# Patient Record
Sex: Female | Born: 1945 | Race: White | Hispanic: No | Marital: Married | Smoking: Never smoker
Health system: Southern US, Community
[De-identification: ages and names within clinical notes are randomized; demographics above are authoritative.]

## PROBLEM LIST (undated history)

## (undated) DIAGNOSIS — R51 Headache: Secondary | ICD-10-CM

## (undated) DIAGNOSIS — R0600 Dyspnea, unspecified: Secondary | ICD-10-CM

## (undated) DIAGNOSIS — R519 Headache, unspecified: Secondary | ICD-10-CM

## (undated) DIAGNOSIS — Z9889 Other specified postprocedural states: Secondary | ICD-10-CM

## (undated) DIAGNOSIS — I499 Cardiac arrhythmia, unspecified: Secondary | ICD-10-CM

## (undated) DIAGNOSIS — T753XXA Motion sickness, initial encounter: Secondary | ICD-10-CM

## (undated) DIAGNOSIS — R112 Nausea with vomiting, unspecified: Secondary | ICD-10-CM

## (undated) DIAGNOSIS — K219 Gastro-esophageal reflux disease without esophagitis: Secondary | ICD-10-CM

## (undated) DIAGNOSIS — F329 Major depressive disorder, single episode, unspecified: Secondary | ICD-10-CM

## (undated) DIAGNOSIS — F32A Depression, unspecified: Secondary | ICD-10-CM

## (undated) DIAGNOSIS — Z8489 Family history of other specified conditions: Secondary | ICD-10-CM

## (undated) DIAGNOSIS — F419 Anxiety disorder, unspecified: Secondary | ICD-10-CM

## (undated) DIAGNOSIS — Z8719 Personal history of other diseases of the digestive system: Secondary | ICD-10-CM

## (undated) HISTORY — PX: ABDOMINAL HYSTERECTOMY: SHX81

## (undated) HISTORY — PX: CATARACT EXTRACTION W/ INTRAOCULAR LENS IMPLANT: SHX1309

## (undated) HISTORY — PX: TONSILLECTOMY: SUR1361

## (undated) HISTORY — PX: HERNIA REPAIR: SHX51

## (undated) HISTORY — PX: PELVIC FLOOR REPAIR: SHX2192

---

## 2005-06-23 ENCOUNTER — Ambulatory Visit: Payer: Self-pay | Admitting: Family Medicine

## 2006-02-14 ENCOUNTER — Ambulatory Visit: Payer: Self-pay | Admitting: Family Medicine

## 2007-05-16 ENCOUNTER — Ambulatory Visit: Payer: Self-pay

## 2008-09-04 ENCOUNTER — Ambulatory Visit: Payer: Self-pay | Admitting: Cardiology

## 2008-09-04 HISTORY — PX: CARDIAC CATHETERIZATION: SHX172

## 2009-03-09 ENCOUNTER — Ambulatory Visit: Payer: Self-pay | Admitting: Family Medicine

## 2009-03-11 ENCOUNTER — Ambulatory Visit: Payer: Self-pay | Admitting: Family Medicine

## 2011-03-08 ENCOUNTER — Ambulatory Visit: Payer: Self-pay | Admitting: Family Medicine

## 2011-03-10 ENCOUNTER — Ambulatory Visit: Payer: Self-pay | Admitting: Family Medicine

## 2012-11-28 ENCOUNTER — Ambulatory Visit: Payer: Self-pay | Admitting: Family Medicine

## 2013-03-05 ENCOUNTER — Ambulatory Visit: Payer: Self-pay | Admitting: Emergency Medicine

## 2015-10-22 ENCOUNTER — Ambulatory Visit
Admission: RE | Admit: 2015-10-22 | Discharge: 2015-10-22 | Disposition: A | Discharge status: Home / Self Care | Payer: Medicare Other | Source: Ambulatory Visit | Attending: Family Medicine | Admitting: Family Medicine

## 2015-10-22 ENCOUNTER — Other Ambulatory Visit: Payer: Self-pay | Admitting: Family Medicine

## 2015-10-22 DIAGNOSIS — R0609 Other forms of dyspnea: Secondary | ICD-10-CM | POA: Insufficient documentation

## 2016-04-28 ENCOUNTER — Other Ambulatory Visit: Payer: Self-pay | Admitting: Pediatrics

## 2016-04-28 DIAGNOSIS — M810 Age-related osteoporosis without current pathological fracture: Secondary | ICD-10-CM

## 2016-05-03 NOTE — Discharge Instructions (Signed)

## 2016-05-06 ENCOUNTER — Encounter: Payer: Self-pay | Admitting: *Deleted

## 2016-05-11 ENCOUNTER — Encounter
Admission: RE | Disposition: A | Discharge status: Home / Self Care | Payer: Self-pay | Source: Ambulatory Visit | Attending: Ophthalmology

## 2016-05-11 ENCOUNTER — Ambulatory Visit
Admission: RE | Admit: 2016-05-11 | Discharge: 2016-05-11 | Disposition: A | Discharge status: Home / Self Care | Payer: Medicare Other | Source: Ambulatory Visit | Attending: Ophthalmology | Admitting: Ophthalmology

## 2016-05-11 ENCOUNTER — Ambulatory Visit: Payer: Medicare Other | Admitting: Anesthesiology

## 2016-05-11 DIAGNOSIS — K449 Diaphragmatic hernia without obstruction or gangrene: Secondary | ICD-10-CM | POA: Diagnosis not present

## 2016-05-11 DIAGNOSIS — F419 Anxiety disorder, unspecified: Secondary | ICD-10-CM | POA: Diagnosis not present

## 2016-05-11 DIAGNOSIS — R51 Headache: Secondary | ICD-10-CM | POA: Insufficient documentation

## 2016-05-11 DIAGNOSIS — K219 Gastro-esophageal reflux disease without esophagitis: Secondary | ICD-10-CM | POA: Insufficient documentation

## 2016-05-11 DIAGNOSIS — H2512 Age-related nuclear cataract, left eye: Secondary | ICD-10-CM | POA: Diagnosis not present

## 2016-05-11 DIAGNOSIS — Z79899 Other long term (current) drug therapy: Secondary | ICD-10-CM | POA: Diagnosis not present

## 2016-05-11 HISTORY — DX: Gastro-esophageal reflux disease without esophagitis: K21.9

## 2016-05-11 HISTORY — DX: Family history of other specified conditions: Z84.89

## 2016-05-11 HISTORY — DX: Depression, unspecified: F32.A

## 2016-05-11 HISTORY — DX: Headache, unspecified: R51.9

## 2016-05-11 HISTORY — DX: Cardiac arrhythmia, unspecified: I49.9

## 2016-05-11 HISTORY — DX: Headache: R51

## 2016-05-11 HISTORY — DX: Major depressive disorder, single episode, unspecified: F32.9

## 2016-05-11 HISTORY — DX: Anxiety disorder, unspecified: F41.9

## 2016-05-11 HISTORY — DX: Other specified postprocedural states: Z98.890

## 2016-05-11 HISTORY — PX: CATARACT EXTRACTION W/PHACO: SHX586

## 2016-05-11 HISTORY — DX: Motion sickness, initial encounter: T75.3XXA

## 2016-05-11 HISTORY — DX: Other specified postprocedural states: R11.2

## 2016-05-11 HISTORY — DX: Dyspnea, unspecified: R06.00

## 2016-05-11 HISTORY — DX: Personal history of other diseases of the digestive system: Z87.19

## 2016-05-11 SURGERY — PHACOEMULSIFICATION, CATARACT, WITH IOL INSERTION
Anesthesia: Monitor Anesthesia Care | Laterality: Left | Wound class: Clean

## 2016-05-11 MED ORDER — LIDOCAINE HCL (PF) 4 % IJ SOLN
INTRAMUSCULAR | Status: DC | PRN
  Administered 2016-05-11: 1 mL via OPHTHALMIC

## 2016-05-11 MED ORDER — MIDAZOLAM HCL 2 MG/2ML IJ SOLN
INTRAMUSCULAR | Status: DC | PRN
  Administered 2016-05-11: 2 mg via INTRAVENOUS

## 2016-05-11 MED ORDER — EPINEPHRINE PF 1 MG/ML IJ SOLN
INTRAMUSCULAR | Status: DC | PRN
  Administered 2016-05-11: 72 mL via OPHTHALMIC

## 2016-05-11 MED ORDER — NA HYALUR & NA CHOND-NA HYALUR 0.4-0.35 ML IO KIT
PACK | INTRAOCULAR | Status: DC | PRN
  Administered 2016-05-11: 1 mL via INTRAOCULAR

## 2016-05-11 MED ORDER — CEFUROXIME OPHTHALMIC INJECTION 1 MG/0.1 ML
INJECTION | OPHTHALMIC | Status: DC | PRN
  Administered 2016-05-11: .3 mL via INTRACAMERAL

## 2016-05-11 MED ORDER — FENTANYL CITRATE (PF) 100 MCG/2ML IJ SOLN
INTRAMUSCULAR | Status: DC | PRN
  Administered 2016-05-11: 50 ug via INTRAVENOUS

## 2016-05-11 MED ORDER — ARMC OPHTHALMIC DILATING DROPS
1.0000 "application " | OPHTHALMIC | Status: DC | PRN
  Administered 2016-05-11 (×3): 1 via OPHTHALMIC

## 2016-05-11 MED ORDER — TIMOLOL MALEATE 0.5 % OP SOLN
OPHTHALMIC | Status: DC | PRN
  Administered 2016-05-11: 1 [drp] via OPHTHALMIC

## 2016-05-11 MED ORDER — BRIMONIDINE TARTRATE 0.2 % OP SOLN
OPHTHALMIC | Status: DC | PRN
  Administered 2016-05-11: 1 [drp] via OPHTHALMIC

## 2016-05-11 MED ORDER — MOXIFLOXACIN HCL 0.5 % OP SOLN
1.0000 [drp] | OPHTHALMIC | Status: DC | PRN
  Administered 2016-05-11 (×3): 1 [drp] via OPHTHALMIC

## 2016-05-11 SURGICAL SUPPLY — 25 items
CANNULA ANT/CHMB 27GA (MISCELLANEOUS) ×3 IMPLANT
CARTRIDGE ABBOTT (MISCELLANEOUS) IMPLANT
GLOVE SURG LX 7.5 STRW (GLOVE) ×2
GLOVE SURG LX STRL 7.5 STRW (GLOVE) ×1 IMPLANT
GLOVE SURG TRIUMPH 8.0 PF LTX (GLOVE) ×3 IMPLANT
GOWN STRL REUS W/ TWL LRG LVL3 (GOWN DISPOSABLE) ×2 IMPLANT
GOWN STRL REUS W/TWL LRG LVL3 (GOWN DISPOSABLE) ×4
LENS IOL TECNIS ITEC 14.5 (Intraocular Lens) ×3 IMPLANT
MARKER SKIN DUAL TIP RULER LAB (MISCELLANEOUS) ×3 IMPLANT
NDL RETROBULBAR .5 NSTRL (NEEDLE) IMPLANT
NEEDLE FILTER BLUNT 18X 1/2SAF (NEEDLE) ×2
NEEDLE FILTER BLUNT 18X1 1/2 (NEEDLE) ×1 IMPLANT
PACK CATARACT BRASINGTON (MISCELLANEOUS) ×3 IMPLANT
PACK EYE AFTER SURG (MISCELLANEOUS) ×3 IMPLANT
PACK OPTHALMIC (MISCELLANEOUS) ×3 IMPLANT
RING MALYGIN 7.0 (MISCELLANEOUS) IMPLANT
SUT ETHILON 10-0 CS-B-6CS-B-6 (SUTURE)
SUT VICRYL  9 0 (SUTURE)
SUT VICRYL 9 0 (SUTURE) IMPLANT
SUTURE EHLN 10-0 CS-B-6CS-B-6 (SUTURE) IMPLANT
SYR 3ML LL SCALE MARK (SYRINGE) ×3 IMPLANT
SYR 5ML LL (SYRINGE) ×3 IMPLANT
SYR TB 1ML LUER SLIP (SYRINGE) ×3 IMPLANT
WATER STERILE IRR 250ML POUR (IV SOLUTION) ×3 IMPLANT
WIPE NON LINTING 3.25X3.25 (MISCELLANEOUS) ×3 IMPLANT

## 2016-05-11 NOTE — Anesthesia Procedure Notes (Signed)
Procedure Name: MAC Performed by: Isiah Scheel Pre-anesthesia Checklist: Patient identified, Emergency Drugs available, Suction available, Timeout performed and Patient being monitored Patient Re-evaluated:Patient Re-evaluated prior to inductionOxygen Delivery Method: Nasal cannula Placement Confirmation: positive ETCO2     

## 2016-05-11 NOTE — H&P (Signed)
The History and Physical notes are on paper, have been signed, and are to be scanned. The patient remains stable and unchanged from the H&P.   Previous H&P reviewed, patient examined, and there are no changes.  Asaph Serena 05/11/2016 8:46 AM

## 2016-05-11 NOTE — Transfer of Care (Signed)
Immediate Anesthesia Transfer of Care Note  Patient: Kim Ramos  Procedure(s) Performed: Procedure(s) with comments: CATARACT EXTRACTION PHACO AND INTRAOCULAR LENS PLACEMENT (IOC) (Left) - requests early  Patient Location: PACU  Anesthesia Type: MAC  Level of Consciousness: awake, alert  and patient cooperative  Airway and Oxygen Therapy: Patient Spontanous Breathing and Patient connected to supplemental oxygen  Post-op Assessment: Post-op Vital signs reviewed, Patient's Cardiovascular Status Stable, Respiratory Function Stable, Patent Airway and No signs of Nausea or vomiting  Post-op Vital Signs: Reviewed and stable  Complications: No apparent anesthesia complications

## 2016-05-11 NOTE — Anesthesia Postprocedure Evaluation (Signed)
Anesthesia Post Note  Patient: Kim Ramos  Procedure(s) Performed: Procedure(s) (LRB): CATARACT EXTRACTION PHACO AND INTRAOCULAR LENS PLACEMENT (IOC) (Left)  Patient location during evaluation: PACU Anesthesia Type: MAC Level of consciousness: awake and alert Pain management: pain level controlled Vital Signs Assessment: post-procedure vital signs reviewed and stable Respiratory status: spontaneous breathing, nonlabored ventilation, respiratory function stable and patient connected to nasal cannula oxygen Cardiovascular status: stable and blood pressure returned to baseline Anesthetic complications: no    Scarlette Sliceachel B Beach

## 2016-05-11 NOTE — Anesthesia Preprocedure Evaluation (Signed)
Anesthesia Evaluation  Patient identified by MRN, date of birth, ID band Patient awake    Reviewed: Allergy & Precautions, H&P , NPO status , Patient's Chart, lab work & pertinent test results, reviewed documented beta blocker date and time   History of Anesthesia Complications (+) PONV and history of anesthetic complications  Airway Mallampati: II  TM Distance: >3 FB Neck ROM: full    Dental no notable dental hx.    Pulmonary neg pulmonary ROS,    Pulmonary exam normal breath sounds clear to auscultation       Cardiovascular Exercise Tolerance: Good + dysrhythmias (palpitations)  Rhythm:regular Rate:Normal     Neuro/Psych  Headaches, PSYCHIATRIC DISORDERS (anxiety)    GI/Hepatic Neg liver ROS, hiatal hernia, GERD  ,  Endo/Other  negative endocrine ROS  Renal/GU negative Renal ROS  negative genitourinary   Musculoskeletal   Abdominal   Peds  Hematology negative hematology ROS (+)   Anesthesia Other Findings   Reproductive/Obstetrics negative OB ROS                             Anesthesia Physical Anesthesia Plan  ASA: II  Anesthesia Plan: MAC   Post-op Pain Management:    Induction:   Airway Management Planned:   Additional Equipment:   Intra-op Plan:   Post-operative Plan:   Informed Consent: I have reviewed the patients History and Physical, chart, labs and discussed the procedure including the risks, benefits and alternatives for the proposed anesthesia with the patient or authorized representative who has indicated his/her understanding and acceptance.   Dental Advisory Given  Plan Discussed with: CRNA  Anesthesia Plan Comments:         Anesthesia Quick Evaluation

## 2016-05-11 NOTE — Op Note (Signed)
OPERATIVE NOTE  Kim BinningVicki P Ramos [id number] 05/11/2016   PREOPERATIVE DIAGNOSIS:  Nuclear sclerotic cataract left eye. H25.12   POSTOPERATIVE DIAGNOSIS:    Nuclear sclerotic cataract left eye.     PROCEDURE:  Phacoemusification with posterior chamber intraocular lens placement of the left eye   LENS:   Implant Name Type Inv. Item Serial No. Manufacturer Lot No. LRB No. Used  LENS IOL DIOP 14.5 - [id number] Intraocular Lens LENS IOL DIOP 14.5 [id number] AMO   Left 1        ULTRASOUND TIME: 11  % of 0 minutes 46+ seconds, CDE 5.1  SURGEON:  Deirdre Evenerhadwick R. Alexa Blish, MD   ANESTHESIA:  Topical with tetracaine drops and 2% Xylocaine jelly, augmented with 1% preservative-free intracameral lidocaine.    COMPLICATIONS:  None.   DESCRIPTION OF PROCEDURE:  The patient was identified in the holding room and transported to the operating room and placed in the supine position under the operating microscope.  The left eye was identified as the operative eye and it was prepped and draped in the usual sterile ophthalmic fashion.   A 1 millimeter clear-corneal paracentesis was made at the 1:30 position.  0.5 ml of preservative-free 1% lidocaine was injected into the anterior chamber.  The anterior chamber was filled with Viscoat viscoelastic.  A 2.4 millimeter keratome was used to make a near-clear corneal incision at the 10:30 position.  .  A curvilinear capsulorrhexis was made with a cystotome and capsulorrhexis forceps.  Balanced salt solution was used to hydrodissect and hydrodelineate the nucleus.   Phacoemulsification was then used in stop and chop fashion to remove the lens nucleus and epinucleus.  The remaining cortex was then removed using the irrigation and aspiration handpiece. Provisc was then placed into the capsular bag to distend it for lens placement.  A lens was then injected into the capsular bag.  The remaining viscoelastic was aspirated.   Wounds were hydrated with balanced salt  solution.  The anterior chamber was inflated to a physiologic pressure with balanced salt solution.  No wound leaks were noted. Cefuroxime 0.1 ml of a 10mg /ml solution was injected into the anterior chamber for a dose of 1 mg of intracameral antibiotic at the completion of the case.   Timolol and Brimonidine drops were applied to the eye.  The patient was taken to the recovery room in stable condition without complications of anesthesia or surgery.  Peniel Biel 05/11/2016, 9:35 AM

## 2016-05-12 ENCOUNTER — Encounter: Payer: Self-pay | Admitting: Ophthalmology

## 2017-08-23 ENCOUNTER — Other Ambulatory Visit: Payer: Self-pay | Admitting: Family Medicine

## 2017-08-23 DIAGNOSIS — Z1239 Encounter for other screening for malignant neoplasm of breast: Secondary | ICD-10-CM

## 2017-08-29 ENCOUNTER — Other Ambulatory Visit: Payer: Self-pay | Admitting: Family Medicine

## 2017-08-29 DIAGNOSIS — Z780 Asymptomatic menopausal state: Secondary | ICD-10-CM

## 2017-11-28 ENCOUNTER — Other Ambulatory Visit: Payer: Self-pay | Admitting: Family Medicine

## 2017-11-28 DIAGNOSIS — R2232 Localized swelling, mass and lump, left upper limb: Secondary | ICD-10-CM

## 2017-12-05 ENCOUNTER — Ambulatory Visit
Admission: RE | Admit: 2017-12-05 | Discharge: 2017-12-05 | Disposition: A | Discharge status: Home / Self Care | Payer: Medicare Other | Source: Ambulatory Visit | Attending: Family Medicine | Admitting: Family Medicine

## 2017-12-05 DIAGNOSIS — R2232 Localized swelling, mass and lump, left upper limb: Secondary | ICD-10-CM | POA: Insufficient documentation

## 2018-08-27 ENCOUNTER — Ambulatory Visit
Admission: RE | Admit: 2018-08-27 | Discharge: 2018-08-27 | Disposition: A | Discharge status: Home / Self Care | Payer: Medicare Other | Source: Ambulatory Visit | Attending: Family Medicine | Admitting: Family Medicine

## 2018-08-27 ENCOUNTER — Other Ambulatory Visit: Payer: Self-pay

## 2018-08-27 DIAGNOSIS — Z1239 Encounter for other screening for malignant neoplasm of breast: Secondary | ICD-10-CM | POA: Diagnosis present

## 2018-08-27 DIAGNOSIS — Z780 Asymptomatic menopausal state: Secondary | ICD-10-CM

## 2018-08-27 DIAGNOSIS — Z1231 Encounter for screening mammogram for malignant neoplasm of breast: Secondary | ICD-10-CM | POA: Insufficient documentation

## 2018-08-27 DIAGNOSIS — M810 Age-related osteoporosis without current pathological fracture: Secondary | ICD-10-CM | POA: Insufficient documentation

## 2020-06-18 ENCOUNTER — Other Ambulatory Visit: Payer: Self-pay | Admitting: Family Medicine

## 2020-06-18 DIAGNOSIS — M810 Age-related osteoporosis without current pathological fracture: Secondary | ICD-10-CM

## 2020-10-02 ENCOUNTER — Ambulatory Visit
Admission: EM | Admit: 2020-10-02 | Discharge: 2020-10-02 | Disposition: A | Discharge status: Home / Self Care | Payer: Medicare PPO | Attending: Emergency Medicine | Admitting: Emergency Medicine

## 2020-10-02 ENCOUNTER — Other Ambulatory Visit: Payer: Self-pay

## 2020-10-02 DIAGNOSIS — W540XXA Bitten by dog, initial encounter: Secondary | ICD-10-CM | POA: Diagnosis not present

## 2020-10-02 DIAGNOSIS — S61451A Open bite of right hand, initial encounter: Secondary | ICD-10-CM

## 2020-10-02 MED ORDER — TETANUS-DIPHTH-ACELL PERTUSSIS 5-2.5-18.5 LF-MCG/0.5 IM SUSY: 0.5000 mL | PREFILLED_SYRINGE | Freq: Once | INTRAMUSCULAR | Status: DC

## 2020-10-02 MED ORDER — HIBICLENS 4 % EX LIQD: Freq: Every day | CUTANEOUS | 0 refills | Status: AC | PRN

## 2020-10-02 MED ORDER — TETANUS-DIPHTHERIA TOXOIDS TD 5-2 LFU IM INJ
0.5000 mL | INJECTION | Freq: Once | INTRAMUSCULAR | Status: AC
  Administered 2020-10-02: 0.5 mL via INTRAMUSCULAR

## 2020-10-02 MED ORDER — AMOXICILLIN-POT CLAVULANATE 875-125 MG PO TABS: 1.0000 | ORAL_TABLET | Freq: Two times a day (BID) | ORAL | 0 refills | Status: AC

## 2020-10-02 NOTE — ED Provider Notes (Signed)
HPI  SUBJECTIVE:  Kim Ramos is a right handed 75 y.o. female who presents with a dog bite to her right index and ring finger sustained earlier today at 0330.  States that she was bitten by her dog while it was having a seizure.  All of its shots are up-to-date.  She reports dull, achy pain described as "uncomfortable".  She reports swelling of the index finger.  No erythema, foreign body sensation, limitation of motion, numbness or tingling.  No purulent drainage.  No other injury to the hand.  She has tried irrigating the wounds out with water.  No aggravating or alleviating factors.  Tetanus is unknown.  She has no past medical history.  PMD: Leim Fabry, MD    Past Medical History:  Diagnosis Date  . Anxiety   . Depression   . Dyspnea    with exertion  . Dysrhythmia    palpatations  . Family history of adverse reaction to anesthesia    mother - PONV, brother - slow to awaken  . GERD (gastroesophageal reflux disease)   . Headache    weekly  . History of hiatal hernia   . Motion sickness    cars, boats, planes  . PONV (postoperative nausea and vomiting)     Past Surgical History:  Procedure Laterality Date  . ABDOMINAL HYSTERECTOMY    . CARDIAC CATHETERIZATION  09/04/2008   [facility], Dr. Darrold Junker  . CATARACT EXTRACTION W/ INTRAOCULAR LENS IMPLANT Right   . CATARACT EXTRACTION W/PHACO Left 05/11/2016   Procedure: CATARACT EXTRACTION PHACO AND INTRAOCULAR LENS PLACEMENT (IOC);  Surgeon: Lockie Mola, MD;  Location: [facility];  Service: Ophthalmology;  Laterality: Left;  requests early  . HERNIA REPAIR    . PELVIC FLOOR REPAIR    . TONSILLECTOMY      History reviewed. No pertinent family history.  Social History   Tobacco Use  . Smoking status: Never Smoker  . Smokeless tobacco: Never Used  Vaping Use  . Vaping Use: Never used  Substance Use Topics  . Alcohol use: Yes    Alcohol/week: 7.0 standard drinks    Types: 7 Glasses of wine per week     No current facility-administered medications for this encounter.  Current Outpatient Medications:  .  acetaminophen (TYLENOL) 500 MG tablet, Take 1,000 mg by mouth at bedtime as needed., Disp: , Rfl:  .  amoxicillin-clavulanate (AUGMENTIN) 875-125 MG tablet, Take 1 tablet by mouth 2 (two) times daily for 10 days., Disp: 20 tablet, Rfl: 0 .  Artificial Tear Ointment (LACRI-LUBE OP), Apply to eye 4 (four) times daily as needed., Disp: , Rfl:  .  chlorhexidine (HIBICLENS) 4 % external liquid, Apply topically daily as needed. Dilute 10-15 mL in water, Use daily when bathing for 1-2 weeks, Disp: 120 mL, Rfl: 0 .  Cyanocobalamin (B-12) 1000 MCG SUBL, Place under the tongue., Disp: , Rfl:  .  diphenhydrAMINE (BENADRYL) 25 MG tablet, Take 25 mg by mouth at bedtime as needed., Disp: , Rfl:  .  latanoprost (XALATAN) 0.005 % ophthalmic solution, Place 1 drop into both eyes at bedtime., Disp: , Rfl:  .  Polyethyl Glycol-Propyl Glycol (SYSTANE OP), Apply to eye at bedtime., Disp: , Rfl:  .  pseudoephedrine (SUDAFED) 120 MG 12 hr tablet, Take 120 mg by mouth every 12 (twelve) hours as needed for congestion., Disp: , Rfl:  .  TRINTELLIX 10 MG TABS tablet, Take by mouth., Disp: , Rfl:  .  omeprazole (PRILOSEC) 20 MG capsule,  Take 20 mg by mouth 2 (two) times daily before a meal., Disp: , Rfl:   Allergies  Allergen Reactions  . Ibuprofen Other (See Comments)    Stomach "burning"  . Prednisolone Swelling    Facial swelling with eye drops  . Cephalexin Nausea And Vomiting    Pt reports that she took this medication approximately 6 months ago and she experienced nausea and vomiting.    . Sulfa Antibiotics Rash     ROS  As noted in HPI.   Physical Exam  BP (!) 153/85 (BP Location: Left Arm)   Pulse (!) 102   Temp 98.1 F (36.7 C) (Oral)   Resp 18   Ht 5\' 3"  (1.6 m)   Wt 68 kg   SpO2 100%   BMI 26.57 kg/m   Constitutional: Well developed, well nourished, no acute distress Eyes:  EOMI,  conjunctiva normal bilaterally HENT: Normocephalic, atraumatic,mucus membranes moist Respiratory: Normal inspiratory effort Cardiovascular: Normal rate GI: nondistended Skin: See MSK exam Musculoskeletal: Right hand: No other injury except to the ring and index finger Index finger: mild Erythema, tenderness, swelling distal phalanx. positive puncture wound at the DIP/distal phalanx.  No expressible purulent drainage.  Cap refill less than 2 seconds.  Sensation light touch and temperature grossly intact.  No tenderness along the flexor tendons.  FDP/FDS intact.  Ring finger: Positive puncture wound at the middle phalanx.  No expressible purulent drainage.  Cap refill less than 2 seconds.  Sensation light touch and temperature grossly intact.  No tenderness along the flexor tendons.  FDP/FDS intact.      Neurologic: Alert & oriented x 3, no focal neuro deficits Psychiatric: Speech and behavior appropriate   ED Course   Medications  tetanus & diphtheria toxoids (adult) (TENIVAC) injection 0.5 mL (0.5 mLs Intramuscular Given 10/02/20 1106)    No orders of the defined types were placed in this encounter.   No results found for this or any previous visit (from the past 24 hour(s)). No results found.  ED Clinical Impression  1. Dog bite of right hand, initial encounter      ED Assessment/Plan  Updating tetanus.  We will send home with Hibiclens, Augmentin because she is right-handed, hand wounds from animal bites are unpredictable and she has a puncture wound near the DIP of the index finger.  May take Tylenol/ibuprofen together 3-4 times a day as needed for pain.  Follow-up with PMD or here as needed.  She also states that she gets frequent yeast infections when she takes antibiotics.  However she states that she does not need a prescription of Diflucan. . . Discussed MDM, treatment plan, and plan for follow-up with patient. Discussed sn/sx that should prompt return to the ED.  patient agrees with plan.   Meds ordered this encounter  Medications  . DISCONTD: Tdap (BOOSTRIX) injection 0.5 mL  . tetanus & diphtheria toxoids (adult) (TENIVAC) injection 0.5 mL  . amoxicillin-clavulanate (AUGMENTIN) 875-125 MG tablet    Sig: Take 1 tablet by mouth 2 (two) times daily for 10 days.    Dispense:  20 tablet    Refill:  0  . chlorhexidine (HIBICLENS) 4 % external liquid    Sig: Apply topically daily as needed. Dilute 10-15 mL in water, Use daily when bathing for 1-2 weeks    Dispense:  120 mL    Refill:  0      *This clinic note was created using 12/02/20. Therefore, there may be occasional mistakes  despite careful proofreading.  ?    Domenick Gong, MD 10/02/20 1112

## 2020-10-02 NOTE — Discharge Instructions (Addendum)
Keep clean with Hibiclens soap and water.  May take 400 mg of ibuprofen combined with 500 to 1000 mg of Tylenol 3-4 times a day as needed for pain.  We have updated your tetanus today.  Finish the Augmentin, even if you feel better.

## 2020-10-02 NOTE — ED Triage Notes (Signed)
Pt c/o dog bite early this morning when her dog was having a seizure. Pt has small laceration to her right index and ring fingers. Pt states dog is up to date on all vaccines.

## 2021-06-14 ENCOUNTER — Other Ambulatory Visit: Payer: Self-pay | Admitting: Family Medicine

## 2021-06-14 DIAGNOSIS — Z1231 Encounter for screening mammogram for malignant neoplasm of breast: Secondary | ICD-10-CM

## 2021-06-14 DIAGNOSIS — Z780 Asymptomatic menopausal state: Secondary | ICD-10-CM

## 2021-12-14 ENCOUNTER — Ambulatory Visit
Admission: RE | Admit: 2021-12-14 | Discharge: 2021-12-14 | Disposition: A | Discharge status: Home / Self Care | Payer: Medicare PPO | Source: Ambulatory Visit | Attending: Family Medicine | Admitting: Family Medicine

## 2021-12-14 DIAGNOSIS — Z1231 Encounter for screening mammogram for malignant neoplasm of breast: Secondary | ICD-10-CM | POA: Diagnosis not present

## 2021-12-14 DIAGNOSIS — Z780 Asymptomatic menopausal state: Secondary | ICD-10-CM

## 2023-05-02 ENCOUNTER — Encounter: Payer: Self-pay | Admitting: Podiatry

## 2023-05-02 ENCOUNTER — Ambulatory Visit: Payer: Medicare PPO | Admitting: Podiatry

## 2023-05-02 VITALS — BP 140/78 | Ht 63.0 in | Wt 150.0 lb

## 2023-05-02 DIAGNOSIS — B351 Tinea unguium: Secondary | ICD-10-CM

## 2023-05-02 DIAGNOSIS — L600 Ingrowing nail: Secondary | ICD-10-CM

## 2023-05-02 NOTE — Progress Notes (Signed)
Chief Complaint  Patient presents with   Nail Problem    Pt is here due to nail problem on left greater toe nail been going on for a year and a half, pt has been seeing another doctor for the issue with no results, its very painful to touch, no injury, pt believes it man be a ingrown toe nail.    Subjective: Patient presents today for evaluation of pain to the medial border left great toe. Patient is concerned for possible ingrown nail.  It is very sensitive to touch.  Patient presents today for further treatment and evaluation.  Past Medical History:  Diagnosis Date   Anxiety    Depression    Dyspnea    with exertion   Dysrhythmia    palpatations   Family history of adverse reaction to anesthesia    mother - PONV, brother - slow to awaken   GERD (gastroesophageal reflux disease)    Headache    weekly   History of hiatal hernia    Motion sickness    cars, boats, planes   PONV (postoperative nausea and vomiting)     Past Surgical History:  Procedure Laterality Date   ABDOMINAL HYSTERECTOMY     CARDIAC CATHETERIZATION  09/04/2008   [facility], Dr. Darrold Junker   CATARACT EXTRACTION W/ INTRAOCULAR LENS IMPLANT Right    CATARACT EXTRACTION W/PHACO Left 05/11/2016   Procedure: CATARACT EXTRACTION PHACO AND INTRAOCULAR LENS PLACEMENT (IOC);  Surgeon: Lockie Mola, MD;  Location: [facility];  Service: Ophthalmology;  Laterality: Left;  requests early   HERNIA REPAIR     PELVIC FLOOR REPAIR     TONSILLECTOMY      Allergies  Allergen Reactions   Ibuprofen Other (See Comments)    Stomach "burning"   Prednisolone Swelling    Facial swelling with eye drops   Cephalexin Nausea And Vomiting    Pt reports that she took this medication approximately 6 months ago and she experienced nausea and vomiting.     Sulfa Antibiotics Rash    Objective:  General: Well developed, nourished, in no acute distress, alert and oriented x3   Dermatology: Skin is warm, dry and supple  bilateral.  Medial border left great toe is tender with evidence of an ingrowing nail. Pain on palpation noted to the border of the nail fold. The remaining nails appear unremarkable at this time.   Vascular: DP and PT pulses palpable.  No clinical evidence of vascular compromise  Neruologic: Grossly intact via light touch bilateral.  Musculoskeletal: No pedal deformity noted  Assesement: #1 Paronychia with ingrowing nail medial border left great toe #2 layered onycholysis left hallux nail plate.  Possible fungal nail infection  Plan of Care:  -Patient evaluated.  -Discussed treatment alternatives and plan of care. Explained nail avulsion procedure and post procedure course to patient. -Patient opted for temporary partial nail avulsion of the ingrown portion of the nail.  -Prior to procedure, local anesthesia infiltration utilized using 3 ml of a 50:50 mixture of 2% plain lidocaine and 0.5% plain marcaine in a normal hallux block fashion and a betadine prep performed.  -Partial temporary nail avulsion without chemical matrixectomy  -Light dressing applied.  Post care instructions provided -Additional debridement of the nail plate was performed prior to dressings that were applied and the ingrowing portion of nail as well as the debrided portion of nail was sent to pathology for fungal culture -Return to clinic 2 weeks  Felecia Shelling, DPM [facility]  Dr. Felecia Shelling, DPM    [address] [place], [place] [address]                Office [phone]  Fax [phone]

## 2023-05-02 NOTE — Addendum Note (Signed)
Addended by: Enedina Finner on: 05/02/2023 04:31 PM   Modules accepted: Orders

## 2023-05-16 ENCOUNTER — Encounter: Payer: Self-pay | Admitting: Podiatry

## 2023-05-16 ENCOUNTER — Ambulatory Visit: Payer: Medicare PPO | Admitting: Podiatry

## 2023-05-16 DIAGNOSIS — B351 Tinea unguium: Secondary | ICD-10-CM

## 2023-05-16 NOTE — Progress Notes (Signed)
   Chief Complaint  Patient presents with   Nail Problem    "It is great but I still can't wear a shoe on this foot."    Subjective: 77 y.o. female presents today status post permanent nail avulsion procedure of the medial border of the left great toe that was performed on 05/02/2023.  Patient states that overall she feels significantly better.  She still concern for the thick discoloration of the nail plate but overall she is very satisfied.   Past Medical History:  Diagnosis Date   Anxiety    Depression    Dyspnea    with exertion   Dysrhythmia    palpatations   Family history of adverse reaction to anesthesia    mother - PONV, brother - slow to awaken   GERD (gastroesophageal reflux disease)    Headache    weekly   History of hiatal hernia    Motion sickness    cars, boats, planes   PONV (postoperative nausea and vomiting)     Objective: Neurovascular status intact.  Skin is warm, dry and supple. Nail and respective nail fold appears to be healing appropriately.  There continues to be some hyperkeratotic nail plate to the left hallux nail  Assessment: #1 s/p partial permanent nail matrixectomy medial border left great toe   Plan of care: -Patient was evaluated  -Light debridement of the periungual debris was performed to the border of the respective toe and nail plate using a tissue nipper. -Rotary bur/Dremel was utilized to smooth the remaining portion of the nail plate -Tolcylen antifungal cream was dispensed to apply to the nail plate daily -Return to clinic as needed   Felecia Shelling, DPM [facility]  Dr. Felecia Shelling, DPM    [address] [place], [place] [address]                Office [phone]  Fax [phone]

## 2023-10-20 ENCOUNTER — Ambulatory Visit (INDEPENDENT_AMBULATORY_CARE_PROVIDER_SITE_OTHER)

## 2023-10-20 ENCOUNTER — Ambulatory Visit: Admission: EM | Admit: 2023-10-20 | Discharge: 2023-10-20 | Disposition: A | Discharge status: Home / Self Care

## 2023-10-20 DIAGNOSIS — S51811A Laceration without foreign body of right forearm, initial encounter: Secondary | ICD-10-CM | POA: Diagnosis not present

## 2023-10-20 DIAGNOSIS — S0990XA Unspecified injury of head, initial encounter: Secondary | ICD-10-CM | POA: Diagnosis not present

## 2023-10-20 DIAGNOSIS — W19XXXA Unspecified fall, initial encounter: Secondary | ICD-10-CM

## 2023-10-20 DIAGNOSIS — M79601 Pain in right arm: Secondary | ICD-10-CM

## 2023-10-20 NOTE — ED Triage Notes (Signed)
 Pt said she fell last night when walking her dog, pt states her right arm hit the wooden ramp and scraped her forearm. Pt says he hit her forehead but has no laceration on forehead and did not pass out during or after the fall.

## 2023-10-20 NOTE — Discharge Instructions (Addendum)
-   X-rays of your shoulder, upper arm and lower arm are obtained today.  I do not see any obvious fractures on initial reading but I will contact you with radiologist sees any acute abnormalities. -The skin tear/laceration was repaired with tissue adhesive.  Do not get this wet for a can remove the integrity of the glue and come off sooner than intended.  Will likely peel away on its own in 5 to 7 days. - Look out for any signs of infection of the wound including increased redness, increased swelling, worsening pain or pustular drainage. - Ice and elevate extremity to help with swelling and discomfort.  Take Tylenol as needed for pain and discomfort. - You have suffered a very minor head injury.  If at any point you experience significant headaches, dizziness, weakness, numbness/tingling, vomiting, confusion, difficulty speaking or walking please call 911 or have someone take immediately to the ER.  Cannot rule out intracranial abnormality as I do not have access to CTV you have no red flag signs or symptoms indicating/condition.  However, if things change please return the ER for evaluation.

## 2023-10-20 NOTE — ED Provider Notes (Signed)
 [facility]    CSN: [id number] Arrival date & time: 10/20/23  1350      History   Chief Complaint Chief Complaint  Patient presents with   Fall    HPI Kim Ramos is a 78 y.o. female presenting for right shoulder, upper arm and forearm pain following accidental fall last night.  She reports falling and hitting a wooden ramp.  States her forehead hit something as she fell but denies loss of consciousness.  She has a large skin tear of the right dorsal forearm.  Has cleaned the area at home and bandaged it.  Tetanus immunization was updated 3 years ago.  Patient is not reporting any significant headaches, vision changes, dizziness, weakness, vomiting, numbness/tingling, confusion, etc.  HPI  Past Medical History:  Diagnosis Date   Anxiety    Depression    Dyspnea    with exertion   Dysrhythmia    palpatations   Family history of adverse reaction to anesthesia    mother - PONV, brother - slow to awaken   GERD (gastroesophageal reflux disease)    Headache    weekly   History of hiatal hernia    Motion sickness    cars, boats, planes   PONV (postoperative nausea and vomiting)     There are no active problems to display for this patient.   Past Surgical History:  Procedure Laterality Date   ABDOMINAL HYSTERECTOMY     CARDIAC CATHETERIZATION  09/04/2008   [facility], Dr. Parks Bollman   CATARACT EXTRACTION W/ INTRAOCULAR LENS IMPLANT Right    CATARACT EXTRACTION W/PHACO Left 05/11/2016   Procedure: CATARACT EXTRACTION PHACO AND INTRAOCULAR LENS PLACEMENT (IOC);  Surgeon: Annell Kidney, MD;  Location: [facility];  Service: Ophthalmology;  Laterality: Left;  requests early   HERNIA REPAIR     PELVIC FLOOR REPAIR     TONSILLECTOMY      OB History   No obstetric history on file.      Home Medications    Prior to Admission medications   Medication Sig Start Date End Date Taking? Authorizing Provider  acetaminophen (TYLENOL) 500 MG tablet Take  1,000 mg by mouth at bedtime as needed.   Yes [provider]  Artificial Tear Ointment (LACRI-LUBE OP) Apply to eye 4 (four) times daily as needed.   Yes [provider]  buPROPion (WELLBUTRIN XL) 150 MG 24 hr tablet Take 150 mg by mouth daily.   Yes [provider]  TRINTELLIX 10 MG TABS tablet Take by mouth. 09/16/20  Yes [provider]  chlorhexidine (HIBICLENS ) 4 % external liquid Apply topically daily as needed. Dilute 10-15 mL in water, Use daily when bathing for 1-2 weeks 10/02/20   Ethlyn Herd, MD  Cyanocobalamin (B-12) 1000 MCG SUBL Place under the tongue.    [provider]  diphenhydrAMINE (BENADRYL) 25 MG tablet Take 25 mg by mouth at bedtime as needed.    [provider]  latanoprost (XALATAN) 0.005 % ophthalmic solution Place 1 drop into both eyes at bedtime.    [provider]  omeprazole (PRILOSEC) 20 MG capsule Take 20 mg by mouth 2 (two) times daily before a meal.    [provider]  Polyethyl Glycol-Propyl Glycol (SYSTANE OP) Apply to eye at bedtime.    [provider]  pseudoephedrine (SUDAFED) 120 MG 12 hr tablet Take 120 mg by mouth every 12 (twelve) hours as needed for congestion.    [provider]    [facility]  History History reviewed. No pertinent family history.  Social History Social History   Tobacco Use   Smoking status: Never   Smokeless tobacco: Never  Vaping Use   Vaping status: Never Used  Substance Use Topics   Alcohol use: Not Currently    Alcohol/week: 7.0 standard drinks of alcohol    Types: 7 Glasses of wine per week   Drug use: Never     Allergies   Ibuprofen, Prednisolone, Cephalexin, and Sulfa antibiotics   Review of Systems Review of Systems  Constitutional:  Negative for fatigue.  Respiratory:  Negative for shortness of breath.   Cardiovascular:  Negative for chest pain and palpitations.  Gastrointestinal:  Negative for nausea and vomiting.   Musculoskeletal:  Positive for arthralgias, joint swelling and myalgias. Negative for back pain, gait problem and neck pain.  Skin:  Positive for color change and wound.  Neurological:  Negative for dizziness, syncope, weakness, numbness and headaches.  Psychiatric/Behavioral:  Negative for confusion.      Physical Exam Triage Vital Signs ED Triage Vitals  Encounter Vitals Group     BP      Systolic BP Percentile      Diastolic BP Percentile      Pulse      Resp      Temp      Temp src      SpO2      Weight      Height      Head Circumference      Peak Flow      Pain Score      Pain Loc      Pain Education      Exclude from Growth Chart    No data found.  Updated Vital Signs BP 126/74 (BP Location: Left Arm)   Pulse (!) 102   Temp 97.6 F (36.4 C) (Oral)   Resp 16   SpO2 97%      Physical Exam Vitals and nursing note reviewed.  Constitutional:      General: She is not in acute distress.    Appearance: Normal appearance. She is not ill-appearing or toxic-appearing.  HENT:     Head: Normocephalic and atraumatic.     Nose: Nose normal.     Mouth/Throat:     Mouth: Mucous membranes are moist.     Pharynx: Oropharynx is clear.  Eyes:     General: No scleral icterus.       Right eye: No discharge.        Left eye: No discharge.     Extraocular Movements: Extraocular movements intact.     Conjunctiva/sclera: Conjunctivae normal.     Pupils: Pupils are equal, round, and reactive to light.  Cardiovascular:     Rate and Rhythm: Normal rate and regular rhythm.     Heart sounds: Normal heart sounds.  Pulmonary:     Effort: Pulmonary effort is normal. No respiratory distress.     Breath sounds: Normal breath sounds.  Musculoskeletal:     Cervical back: Neck supple.     Comments: RIGHT ARM: There is swelling of the anterior shoulder and TTP proximal humerus and AC joint. TTP along humerus and mid forearm. Full ROM of shoulder, elbow and wrist. Good pulses   Skin:    General: Skin is dry.     Comments: Large skin tear laceration (7 cm) of right forearm  Neurological:     General: No focal deficit present.     Mental Status:  She is alert and oriented to person, place, and time. Mental status is at baseline.     Cranial Nerves: No cranial nerve deficit (grossly intact).     Motor: No weakness.     Coordination: Coordination normal.     Gait: Gait normal.  Psychiatric:        Mood and Affect: Mood normal.        Behavior: Behavior normal.      UC Treatments / Results  Labs (all labs ordered are listed, but only abnormal results are displayed) Labs Reviewed - No data to display  EKG   Radiology No results found.  Procedures Procedures (including critical care time)  Laceration repair: Right dorsal forearm with 7 cm superficial laceration/skin tear.  Patient gives consent for laceration repair with Dermabond.  Area cleansed with wound cleanser.  No anesthesia used.  Applied Dermabond and patient tolerated this well.  After Dermabond dried, applied nonadherent pad and Coban.  Medications Ordered in UC Medications - No data to display  Initial Impression / Assessment and Plan / UC Course  I have reviewed the triage vital signs and the nursing notes.  Pertinent labs & imaging results that were available during my care of the patient were reviewed by me and considered in my medical decision making (see chart for details).   78 year old female presents for right shoulder pain, right upper arm and forearm pain as well as laceration right dorsal forearm that occurred last night when she fell while taking her dog out.  Reports minor head injury but denies loss of consciousness.  Not on any anticoagulant medicines.  Tetanus immunization performed 3 years ago.    Vitals are stable and normal and she is overall well-appearing.  Cranial nerves grossly intact.  Normal gait and.  No evidence of head trauma.  Has large laceration/skin tear of  right dorsal, swelling and tenderness of the right shoulder as well as tenderness of the humerus and mid forearm.  X-rays of the shoulder, humerus and forearm obtained.  Wet read on all imaging without acute abnormality.  Discussed with patient.  Discussed wound care guidelines with patient and also reviewed rest, ice, Tylenol as needed for discomfort.  Explained that I will contact her if the radiologist sees any abnormalities with her imaging.  Reviewed returning for any signs of infection of the wound.  Advised if shoulder pain worsens or she has limited range of motion she should follow-up with orthopedics but she has good range of motion of the shoulder today.  In regard to the minor head injury, no significant concerns that patient needs CT at this time.  No visible head injury, normal neuroexam and patient is not on anticoagulants however, I did thoroughly reviewed ED precautions for head injury with patient and husband.   Final Clinical Impressions(s) / UC Diagnoses   Final diagnoses:  Right arm pain  Skin tear of right forearm without complication, initial encounter  Accidental fall, initial encounter  Minor head injury, initial encounter     Discharge Instructions      - X-rays of your shoulder, upper arm and lower arm are obtained today.  I do not see any obvious fractures on initial reading but I will contact you with radiologist sees any acute abnormalities. -The skin tear/laceration was repaired with tissue adhesive.  Do not get this wet for a can remove the integrity of the glue and come off sooner than intended.  Will likely peel away on its own in 5  to 7 days. - Look out for any signs of infection of the wound including increased redness, increased swelling, worsening pain or pustular drainage. - Ice and elevate extremity to help with swelling and discomfort.  Take Tylenol as needed for pain and discomfort. - You have suffered a very minor head injury.  If at any point you  experience significant headaches, dizziness, weakness, numbness/tingling, vomiting, confusion, difficulty speaking or walking please call 911 or have someone take immediately to the ER.  Cannot rule out intracranial abnormality as I do not have access to CTV you have no red flag signs or symptoms indicating/condition.  However, if things change please return the ER for evaluation.     ED Prescriptions   None    PDMP not reviewed this encounter.   Floydene Hy, PA-C 10/20/23 [phone]
